# Patient Record
Sex: Male | Born: 2000 | Race: Black or African American | Hispanic: No | Marital: Single
Health system: Southern US, Community
[De-identification: ages and names within clinical notes are randomized; demographics above are authoritative.]

---
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
## 2022-06-10 ENCOUNTER — Other Ambulatory Visit: Payer: Medicaid Other

## 2022-06-10 NOTE — Progress Notes (Signed)
Pt completed pre-employment uds. HR notified. ?

## 2024-10-27 ENCOUNTER — Encounter: Payer: Self-pay | Admitting: Physician Assistant

## 2024-10-27 ENCOUNTER — Ambulatory Visit: Payer: Self-pay

## 2024-10-27 ENCOUNTER — Ambulatory Visit: Payer: Self-pay | Admitting: Physician Assistant

## 2024-10-27 VITALS — Temp 98.7°F | Ht 69.5 in | Wt 183.0 lb

## 2024-10-27 VITALS — BP 129/71 | HR 73 | Temp 98.7°F | Resp 14

## 2024-10-27 DIAGNOSIS — Z0289 Encounter for other administrative examinations: Secondary | ICD-10-CM

## 2024-10-27 LAB — POCT URINALYSIS DIPSTICK
Bilirubin, UA: NEGATIVE
Blood, UA: NEGATIVE
Glucose, UA: NEGATIVE
Ketones, UA: NEGATIVE
Leukocytes, UA: NEGATIVE
Nitrite, UA: NEGATIVE
Protein, UA: NEGATIVE
Spec Grav, UA: <=1.005 — AB (ref 1.010–1.025)
Urobilinogen, UA: 0.2 U/dL
pH, UA: 6.5 (ref 5.0–8.0)
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
## 2024-10-27 NOTE — Progress Notes (Unsigned)
 Here for pre employment BLET physical for [employer].  Non fasting labs drawn, UA, drug screen, EKG, hearing screening, spirometry screening and vision screening completed with consent.  UDS cleared.

## 2024-10-28 LAB — CMP12+LP+TP+TSH+6AC+CBC/D/PLT
ALT: 16 IU/L (ref 0–44)
AST: 24 IU/L (ref 0–40)
Albumin: 4.8 g/dL (ref 4.3–5.2)
Alkaline Phosphatase: 74 IU/L (ref 47–123)
BUN/Creatinine Ratio: 10 (ref 9–20)
BUN: 13 mg/dL (ref 6–20)
Basophils Absolute: 0 x10E3/uL (ref 0.0–0.2)
Basos: 1 %
Bilirubin Total: 0.7 mg/dL (ref 0.0–1.2)
Calcium: 10.4 mg/dL — ABNORMAL HIGH (ref 8.7–10.2)
Chloride: 101 mmol/L (ref 96–106)
Chol/HDL Ratio: 3.1 ratio (ref 0.0–5.0)
Cholesterol, Total: 156 mg/dL (ref 100–199)
Creatinine, Ser: 1.26 mg/dL (ref 0.76–1.27)
EOS (ABSOLUTE): 0.1 x10E3/uL (ref 0.0–0.4)
Eos: 1 %
Estimated CHD Risk: <0.5 times avg. (ref 0.0–1.0)
Free Thyroxine Index: 2.3 (ref 1.2–4.9)
GGT: 16 IU/L (ref 0–65)
Globulin, Total: 3 g/dL (ref 1.5–4.5)
Glucose: 97 mg/dL (ref 70–99)
HDL: 51 mg/dL (ref >39)
Hematocrit: 48.1 % (ref 37.5–51.0)
Hemoglobin: 16.1 g/dL (ref 13.0–17.7)
Immature Grans (Abs): 0 x10E3/uL (ref 0.0–0.1)
Immature Granulocytes: 0 %
Iron: 137 ug/dL (ref 38–169)
LDH: 190 IU/L (ref 121–224)
LDL Chol Calc (NIH): 93 mg/dL (ref 0–99)
Lymphocytes Absolute: 2.2 x10E3/uL (ref 0.7–3.1)
Lymphs: 29 %
MCH: 32.3 pg (ref 26.6–33.0)
MCHC: 33.5 g/dL (ref 31.5–35.7)
MCV: 96 fL (ref 79–97)
Monocytes Absolute: 0.6 x10E3/uL (ref 0.1–0.9)
Monocytes: 8 %
Neutrophils Absolute: 4.7 x10E3/uL (ref 1.4–7.0)
Neutrophils: 61 %
Phosphorus: 3.5 mg/dL (ref 2.8–4.1)
Platelets: 320 x10E3/uL (ref 150–450)
Potassium: 4.2 mmol/L (ref 3.5–5.2)
RBC: 4.99 x10E6/uL (ref 4.14–5.80)
RDW: 12.3 % (ref 11.6–15.4)
Sodium: 141 mmol/L (ref 134–144)
T3 Uptake Ratio: 29 % (ref 24–39)
T4, Total: 8.1 ug/dL (ref 4.5–12.0)
TSH: 1.09 u[IU]/mL (ref 0.450–4.500)
Total Protein: 7.8 g/dL (ref 6.0–8.5)
Triglycerides: 61 mg/dL (ref 0–149)
Uric Acid: 5.8 mg/dL (ref 3.8–8.4)
VLDL Cholesterol Cal: 12 mg/dL (ref 5–40)
WBC: 7.5 x10E3/uL (ref 3.4–10.8)
eGFR: 83 mL/min/1.73 (ref >59)

## 2024-10-28 LAB — HEPATITIS B SURFACE ANTIBODY,QUALITATIVE: Hep B Surface Ab, Qual: NONREACTIVE
